# Patient Record
Sex: Female | Born: 1935 | Race: White | Hispanic: No | Smoking: Never smoker
Health system: Southern US, Community
[De-identification: ages and names within clinical notes are randomized; demographics above are authoritative.]

## PROBLEM LIST (undated history)

## (undated) DIAGNOSIS — I10 Essential (primary) hypertension: Secondary | ICD-10-CM

## (undated) DIAGNOSIS — F32A Depression, unspecified: Secondary | ICD-10-CM

## (undated) DIAGNOSIS — E079 Disorder of thyroid, unspecified: Secondary | ICD-10-CM

## (undated) DIAGNOSIS — G459 Transient cerebral ischemic attack, unspecified: Secondary | ICD-10-CM

## (undated) DIAGNOSIS — I4891 Unspecified atrial fibrillation: Secondary | ICD-10-CM

## (undated) DIAGNOSIS — F329 Major depressive disorder, single episode, unspecified: Secondary | ICD-10-CM

## (undated) HISTORY — PX: ABDOMINAL HYSTERECTOMY: SHX81

## (undated) HISTORY — PX: CHOLECYSTECTOMY: SHX55

## (undated) HISTORY — PX: OTHER SURGICAL HISTORY: SHX169

---

## 2017-06-29 ENCOUNTER — Encounter (HOSPITAL_BASED_OUTPATIENT_CLINIC_OR_DEPARTMENT_OTHER): Payer: Self-pay

## 2017-06-29 ENCOUNTER — Emergency Department (HOSPITAL_BASED_OUTPATIENT_CLINIC_OR_DEPARTMENT_OTHER): Payer: Medicare HMO

## 2017-06-29 ENCOUNTER — Emergency Department (HOSPITAL_BASED_OUTPATIENT_CLINIC_OR_DEPARTMENT_OTHER)
Admission: EM | Admit: 2017-06-29 | Discharge: 2017-06-29 | Disposition: A | Discharge status: Home / Self Care | Payer: Medicare HMO | Attending: Emergency Medicine | Admitting: Emergency Medicine

## 2017-06-29 DIAGNOSIS — Y9301 Activity, walking, marching and hiking: Secondary | ICD-10-CM | POA: Diagnosis not present

## 2017-06-29 DIAGNOSIS — Z79899 Other long term (current) drug therapy: Secondary | ICD-10-CM | POA: Insufficient documentation

## 2017-06-29 DIAGNOSIS — I10 Essential (primary) hypertension: Secondary | ICD-10-CM | POA: Diagnosis not present

## 2017-06-29 DIAGNOSIS — W108XXA Fall (on) (from) other stairs and steps, initial encounter: Secondary | ICD-10-CM | POA: Diagnosis not present

## 2017-06-29 DIAGNOSIS — Y999 Unspecified external cause status: Secondary | ICD-10-CM | POA: Diagnosis not present

## 2017-06-29 DIAGNOSIS — S7001XA Contusion of right hip, initial encounter: Secondary | ICD-10-CM | POA: Insufficient documentation

## 2017-06-29 DIAGNOSIS — S50311A Abrasion of right elbow, initial encounter: Secondary | ICD-10-CM | POA: Insufficient documentation

## 2017-06-29 DIAGNOSIS — S0993XA Unspecified injury of face, initial encounter: Secondary | ICD-10-CM | POA: Diagnosis present

## 2017-06-29 DIAGNOSIS — S0990XA Unspecified injury of head, initial encounter: Secondary | ICD-10-CM | POA: Diagnosis not present

## 2017-06-29 DIAGNOSIS — Y929 Unspecified place or not applicable: Secondary | ICD-10-CM | POA: Insufficient documentation

## 2017-06-29 DIAGNOSIS — Z7901 Long term (current) use of anticoagulants: Secondary | ICD-10-CM | POA: Diagnosis not present

## 2017-06-29 DIAGNOSIS — M79671 Pain in right foot: Secondary | ICD-10-CM | POA: Insufficient documentation

## 2017-06-29 DIAGNOSIS — M79672 Pain in left foot: Secondary | ICD-10-CM | POA: Insufficient documentation

## 2017-06-29 DIAGNOSIS — W19XXXA Unspecified fall, initial encounter: Secondary | ICD-10-CM

## 2017-06-29 DIAGNOSIS — S51011A Laceration without foreign body of right elbow, initial encounter: Secondary | ICD-10-CM

## 2017-06-29 HISTORY — DX: Disorder of thyroid, unspecified: E07.9

## 2017-06-29 HISTORY — DX: Essential (primary) hypertension: I10

## 2017-06-29 HISTORY — DX: Transient cerebral ischemic attack, unspecified: G45.9

## 2017-06-29 HISTORY — DX: Depression, unspecified: F32.A

## 2017-06-29 HISTORY — DX: Major depressive disorder, single episode, unspecified: F32.9

## 2017-06-29 HISTORY — DX: Unspecified atrial fibrillation: I48.91

## 2017-06-29 LAB — PROTIME-INR
INR: 3.14
Prothrombin Time: 32 seconds — ABNORMAL HIGH (ref 11.4–15.2)

## 2017-06-29 NOTE — ED Provider Notes (Addendum)
[facility] Provider Note   CSN: [id number] Arrival date & time: 06/29/17  1921     History   Chief Complaint Chief Complaint  Patient presents with  . Fall    HPI Margaret Madden is a 83 y.o. female.  HPI  83 year old female presents after a fall. Slipped down the last few steps at about 230 pm. Did not hit her head but thinks she hit her jaw on the right side. Was sore for a while but now gone. Took tylenol at about 3 pm. Has ecchymosis and pain to the right lateral hip. Also bilateral toe/feet/ankle pain. No swelling. Small right elbow skin tear. No pain to the elbow. No headache, facial pain, neck pain, back pain, chest pain, dyspnea or abdominal pain. Did not feel dizzy. She was wearing socks and thinks she slipped on the steps. Tetanus up to date less than 5 years ago.  Past Medical History:  Diagnosis Date  . Atrial fibrillation (HCC)   . Depression   . Hypertension   . Thyroid disease   . TIA (transient ischemic attack)     There are no active problems to display for this patient.   Past Surgical History:  Procedure Laterality Date  . ABDOMINAL HYSTERECTOMY    . CHOLECYSTECTOMY    . thyroid      OB History    No data available       Home Medications    Prior to Admission medications   Medication Sig Start Date End Date Taking? Authorizing Provider  escitalopram (LEXAPRO) 10 MG tablet Take 10 mg by mouth daily.   Yes [provider]  levothyroxine (SYNTHROID, LEVOTHROID) 75 MCG tablet Take 75 mcg by mouth daily before breakfast.   Yes [provider]  lisinopril (PRINIVIL,ZESTRIL) 20 MG tablet Take 20 mg by mouth daily.   Yes [provider]  metoprolol tartrate (LOPRESSOR) 50 MG tablet Take 50 mg by mouth 2 (two) times daily.   Yes [provider]  warfarin (COUMADIN) 5 MG tablet Take 5 mg by mouth daily.   Yes [provider]    Family History History reviewed. No pertinent family  history.  Social History Social History  Substance Use Topics  . Smoking status: Never Smoker  . Smokeless tobacco: Not on file  . Alcohol use No     Allergies   Sulfa antibiotics   Review of Systems Review of Systems  Respiratory: Negative for shortness of breath.   Cardiovascular: Negative for chest pain.  Gastrointestinal: Negative for abdominal pain and vomiting.  Musculoskeletal: Positive for arthralgias. Negative for back pain and neck pain.  Skin: Positive for wound.  Neurological: Negative for dizziness, syncope, weakness, numbness and headaches.  All other systems reviewed and are negative.    Physical Exam Updated Vital Signs BP (!) 163/99   Pulse 64   Temp 98 F (36.7 C) (Oral)   Resp 18   SpO2 98%   Physical Exam  Constitutional: She is oriented to person, place, and time. She appears well-developed and well-nourished. No distress.  HENT:  Head: Normocephalic and atraumatic.  Right Ear: External ear normal.  Left Ear: External ear normal.  Nose: Nose normal.  Eyes: Pupils are equal, round, and reactive to light. EOM are normal. Right eye exhibits no discharge. Left eye exhibits no discharge.  Neck: Neck supple. No spinous process tenderness and no muscular tenderness present.  Cardiovascular: Normal rate, regular rhythm and normal heart sounds.   Pulses:  Radial pulses are 2+ on the right side, and 2+ on the left side.  Pulmonary/Chest: Effort normal and breath sounds normal.  Abdominal: Soft. There is no tenderness.  Musculoskeletal:       Right elbow: She exhibits laceration. She exhibits normal range of motion. No tenderness found.       Right hip: She exhibits tenderness. She exhibits normal range of motion.       Right ankle: She exhibits normal range of motion. No tenderness.       Left ankle: She exhibits normal range of motion. No tenderness.       Arms:      Right lower leg: She exhibits no tenderness.       Left lower leg: She  exhibits no tenderness.       Legs:      Right foot: There is tenderness (mild, distal).       Left foot: There is tenderness (mild distal).  Neurological: She is alert and oriented to person, place, and time.  Skin: Skin is warm and dry. She is not diaphoretic.  Nursing note and vitals reviewed.    ED Treatments / Results  Labs (all labs ordered are listed, but only abnormal results are displayed) Labs Reviewed  PROTIME-INR - Abnormal; Notable for the following:       Result Value   Prothrombin Time 32.0 (*)    All other components within normal limits    EKG  EKG Interpretation None       Radiology Dg Ankle Complete Left  Result Date: 06/29/2017 CLINICAL DATA:  Larey Seat down stairs this evening, BILATERAL foot and ankle pain RIGHT greater than LEFT, RIGHT hip pain, initial encounter EXAM: LEFT ANKLE COMPLETE - 3+ VIEW COMPARISON:  None FINDINGS: Diffuse soft tissue swelling lower leg and ankle. Bones demineralized. Joint spaces preserved. No acute fracture, dislocation, or bone destruction. Plantar and Achilles insertion calcaneal spurs. IMPRESSION: Calcaneal spurring without acute bony abnormalities. Electronically Signed   By: Ulyses Southward M.D.   On: 06/29/2017 20:48   Dg Ankle Complete Right  Result Date: 06/29/2017 CLINICAL DATA:  Larey Seat down stairs this evening, BILATERAL foot and ankle pain RIGHT greater than LEFT, RIGHT hip pain, initial encounter EXAM: RIGHT ANKLE - COMPLETE 3+ VIEW COMPARISON:  None FINDINGS: Scattered soft tissue swelling RIGHT lower leg and ankle. Bones appear demineralized. Joint spaces preserved. Plantar calcaneal spur. No acute fracture, dislocation, or bone destruction. IMPRESSION: No acute osseous abnormalities. Electronically Signed   By: Ulyses Southward M.D.   On: 06/29/2017 20:46   Ct Head Wo Contrast  Result Date: 06/29/2017 CLINICAL DATA:  Status post fall. EXAM: CT HEAD WITHOUT CONTRAST TECHNIQUE: Contiguous axial images were obtained from the  base of the skull through the vertex without intravenous contrast. COMPARISON:  None. FINDINGS: Brain: No evidence of acute infarction, hemorrhage, hydrocephalus, extra-axial collection or mass lesion/mass effect. Moderate brain parenchymal volume loss and advanced periventricular microangiopathy. Vascular: Calcific atherosclerotic disease at the skullbase. Skull: Normal. Negative for fracture or focal lesion. Sinuses/Orbits: No acute finding. Other: None. IMPRESSION: No acute intracranial abnormality. Advanced chronic microvascular disease. Electronically Signed   By: Ted Mcalpine M.D.   On: 06/29/2017 20:50   Dg Foot Complete Left  Result Date: 06/29/2017 CLINICAL DATA:  Larey Seat down stairs this evening, BILATERAL foot and ankle pain RIGHT greater than LEFT, RIGHT hip pain, initial encounter EXAM: LEFT FOOT - COMPLETE 3+ VIEW COMPARISON:  None FINDINGS: Osseous demineralization. Joint spaces preserved. No acute fracture, dislocation,  or bone destruction. Plantar and Achilles insertion calcaneal spurs noted. Dorsal soft tissue swelling overlying the metatarsals. IMPRESSION: Calcaneal spurring. No acute bony abnormalities. Electronically Signed   By: Ulyses Southward M.D.   On: 06/29/2017 20:45   Dg Foot Complete Right  Result Date: 06/29/2017 CLINICAL DATA:  Larey Seat down stairs this evening, BILATERAL foot and ankle pain RIGHT greater than LEFT, RIGHT hip pain, initial encounter EXAM: RIGHT FOOT COMPLETE - 3+ VIEW COMPARISON:  None FINDINGS: Osseous demineralization. Joint spaces preserved. No acute fracture, dislocation, or bone destruction. Small plantar calcaneal spur. Dorsal soft tissue swelling overlying the metatarsals. IMPRESSION: No acute osseous abnormalities. Electronically Signed   By: Ulyses Southward M.D.   On: 06/29/2017 20:44   Dg Hip Unilat With Pelvis 2-3 Views Right  Result Date: 06/29/2017 CLINICAL DATA:  Larey Seat down stairs this evening, BILATERAL foot and ankle pain RIGHT greater than LEFT,  RIGHT hip pain, initial encounter EXAM: DG HIP (WITH OR WITHOUT PELVIS) 2-3V RIGHT COMPARISON:  None FINDINGS: Diffuse osseous demineralization. Hip and SI joint spaces preserved. No acute fracture, dislocation, or bone destruction. IMPRESSION: No definite acute bony abnormalities. Electronically Signed   By: Ulyses Southward M.D.   On: 06/29/2017 20:44    Procedures Procedures (including critical care time)  Medications Ordered in ED Medications - No data to display   Initial Impression / Assessment and Plan / ED Course  I have reviewed the triage vital signs and the nursing notes.  Pertinent labs & imaging results that were available during my care of the patient were reviewed by me and considered in my medical decision making (see chart for details).     Patient's x-rays and CT showed no acute pathology. She has no current headache. She is well-appearing. She is able to ambulate and thus my suspicion for an occult hip fracture or lower extremity fracture is quite low. She has an impressive contusion to her right thigh but otherwise soft compartments. Discussed Tylenol and ice. Follow-up with PCP as needed. Discussed strict return precautions.  Final Clinical Impressions(s) / ED Diagnoses   Final diagnoses:  Fall, initial encounter  Skin tear of right elbow without complication, initial encounter  Contusion of right hip, initial encounter    New Prescriptions Discharge Medication List as of 06/29/2017  9:04 PM       Pricilla Loveless, MD 06/29/17 1610    Pricilla Loveless, MD 06/29/17 2220

## 2017-06-29 NOTE — ED Notes (Signed)
Patient transported to X-ray 

## 2017-06-29 NOTE — ED Triage Notes (Addendum)
Pt c/o fall x 6 hrs ago c/o left foot and right hip and right elbow pain , also hitting head and pt is on warfarin

## 2018-07-28 IMAGING — DX DG HIP (WITH OR WITHOUT PELVIS) 2-3V*R*
3 series · 3 of 3 positions shown · non-contrast
Comparison: None

CLINICAL DATA: Fell down stairs this evening, BILATERAL foot and
ankle pain RIGHT greater than LEFT, RIGHT hip pain, initial
encounter

EXAM:
DG HIP (WITH OR WITHOUT PELVIS) 2-3V RIGHT

[pelvis ap]
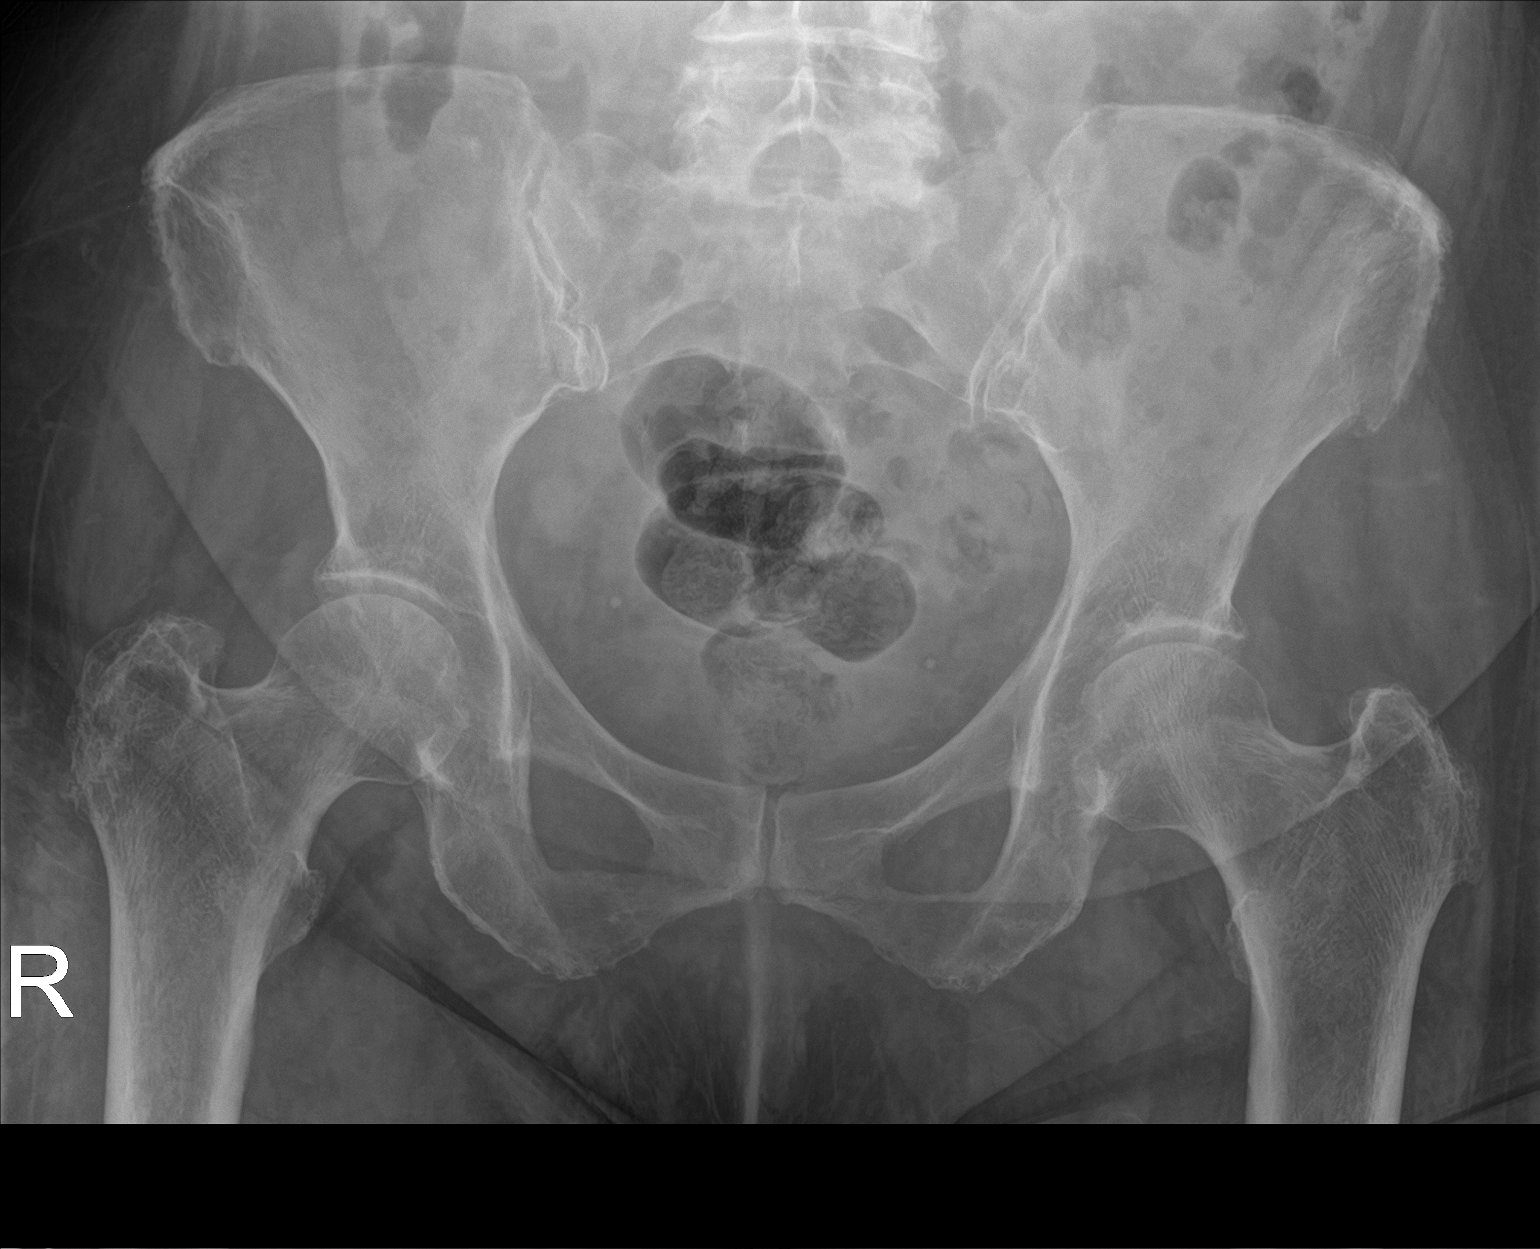

[hip ap]
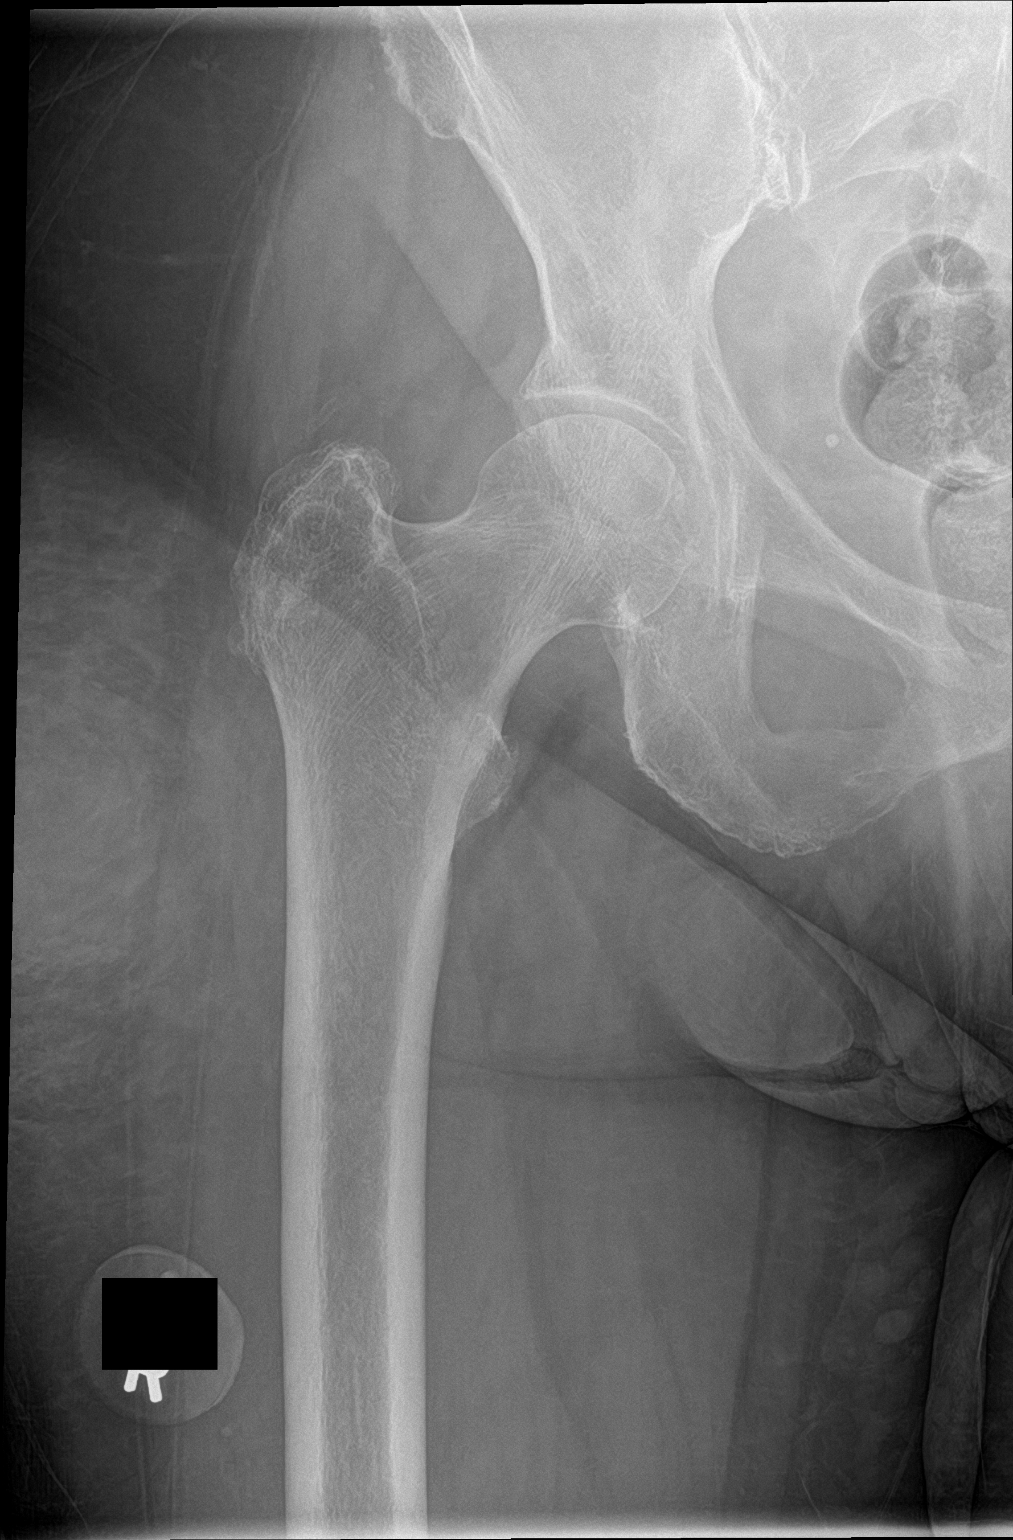

[hip lat]
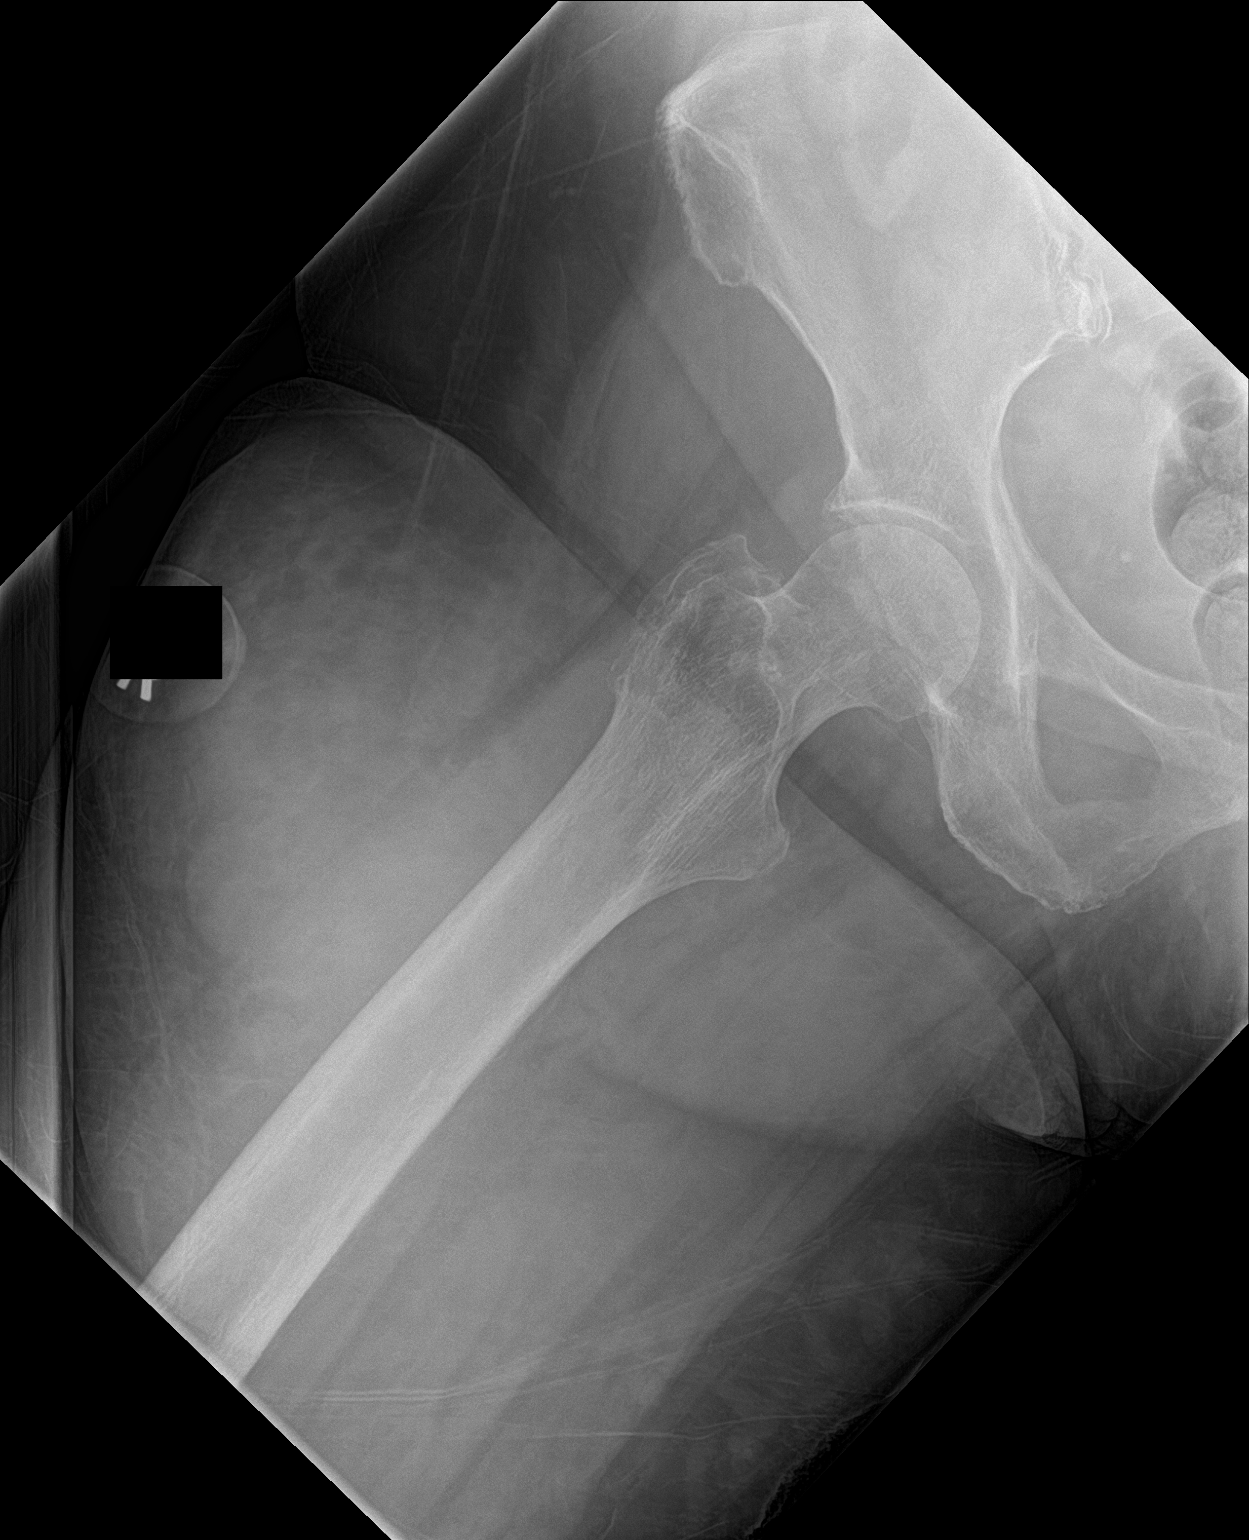

[3 of 3 positions shown; findings below may reference images not displayed]

FINDINGS: Diffuse osseous demineralization.

Hip and SI joint spaces preserved.

No acute fracture, dislocation, or bone destruction.
IMPRESSION: No definite acute bony abnormalities.
